# Patient Record
Sex: Male | Born: 1990 | Race: White | Hispanic: No | Marital: Single | Smoking: Current every day smoker
Health system: Southern US, Community
[De-identification: ages and names within clinical notes are randomized; demographics above are authoritative.]

## PROBLEM LIST (undated history)

## (undated) DIAGNOSIS — R011 Cardiac murmur, unspecified: Secondary | ICD-10-CM

---
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
## 2009-06-29 ENCOUNTER — Ambulatory Visit (HOSPITAL_COMMUNITY): Admission: RE | Admit: 2009-06-29 | Discharge: 2009-06-29 | Payer: Self-pay | Admitting: Family Medicine

## 2015-04-09 ENCOUNTER — Emergency Department (HOSPITAL_COMMUNITY)
Admission: EM | Admit: 2015-04-09 | Discharge: 2015-04-09 | Disposition: A | Discharge status: Home / Self Care | Payer: Self-pay | Attending: Emergency Medicine | Admitting: Emergency Medicine

## 2015-04-09 ENCOUNTER — Emergency Department (HOSPITAL_COMMUNITY): Payer: Self-pay

## 2015-04-09 ENCOUNTER — Encounter (HOSPITAL_COMMUNITY): Payer: Self-pay | Admitting: Emergency Medicine

## 2015-04-09 DIAGNOSIS — Y998 Other external cause status: Secondary | ICD-10-CM | POA: Insufficient documentation

## 2015-04-09 DIAGNOSIS — R011 Cardiac murmur, unspecified: Secondary | ICD-10-CM | POA: Insufficient documentation

## 2015-04-09 DIAGNOSIS — Z880 Allergy status to penicillin: Secondary | ICD-10-CM | POA: Insufficient documentation

## 2015-04-09 DIAGNOSIS — Y9311 Activity, swimming: Secondary | ICD-10-CM | POA: Insufficient documentation

## 2015-04-09 DIAGNOSIS — Z720 Tobacco use: Secondary | ICD-10-CM | POA: Insufficient documentation

## 2015-04-09 DIAGNOSIS — Y92828 Other wilderness area as the place of occurrence of the external cause: Secondary | ICD-10-CM | POA: Insufficient documentation

## 2015-04-09 DIAGNOSIS — S8002XA Contusion of left knee, initial encounter: Secondary | ICD-10-CM | POA: Insufficient documentation

## 2015-04-09 DIAGNOSIS — W2209XA Striking against other stationary object, initial encounter: Secondary | ICD-10-CM | POA: Insufficient documentation

## 2015-04-09 HISTORY — DX: Cardiac murmur, unspecified: R01.1

## 2015-04-09 MED ORDER — NAPROXEN 500 MG PO TABS: 500.0000 mg | ORAL_TABLET | Freq: Two times a day (BID) | ORAL | Status: AC

## 2015-04-09 MED ORDER — IBUPROFEN 400 MG PO TABS
800.0000 mg | ORAL_TABLET | Freq: Once | ORAL | Status: DC
  Filled 2015-04-09: qty 2

## 2015-04-09 NOTE — ED Notes (Signed)
Pt at x-ray XDEPT.
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
## 2015-04-09 NOTE — ED Notes (Signed)
Declined W/C at D/C and was escorted to lobby by RN. 
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
## 2015-04-09 NOTE — Discharge Instructions (Signed)
Naprosyn for pain and inflammation. Keep knee elevated. Keep wound clean. Wash with soap and water. Apply bacitracin twice a day. Follow up with her primary care doctor.  Knee Pain The knee is the complex joint between your thigh and your lower leg. It is made up of bones, tendons, ligaments, and cartilage. The bones that make up the knee are:  The femur in the thigh.  The tibia and fibula in the lower leg.  The patella or kneecap riding in the groove on the lower femur. CAUSES  Knee pain is a common complaint with many causes. A few of these causes are:  Injury, such as:  A ruptured ligament or tendon injury.  Torn cartilage.  Medical conditions, such as:  Gout  Arthritis  Infections  Overuse, over training, or overdoing a physical activity. Knee pain can be minor or severe. Knee pain can accompany debilitating injury. Minor knee problems often respond well to self-care measures or get well on their own. More serious injuries may need medical intervention or even surgery. SYMPTOMS The knee is complex. Symptoms of knee problems can vary widely. Some of the problems are:  Pain with movement and weight bearing.  Swelling and tenderness.  Buckling of the knee.  Inability to straighten or extend your knee.  Your knee locks and you cannot straighten it.  Warmth and redness with pain and fever.  Deformity or dislocation of the kneecap. DIAGNOSIS  Determining what is wrong may be very straight forward such as when there is an injury. It can also be challenging because of the complexity of the knee. Tests to make a diagnosis may include:  Your caregiver taking a history and doing a physical exam.  Routine X-rays can be used to rule out other problems. X-rays will not reveal a cartilage tear. Some injuries of the knee can be diagnosed by:  Arthroscopy a surgical technique by which a small video camera is inserted through tiny incisions on the sides of the knee. This  procedure is used to examine and repair internal knee joint problems. Tiny instruments can be used during arthroscopy to repair the torn knee cartilage (meniscus).  Arthrography is a radiology technique. A contrast liquid is directly injected into the knee joint. Internal structures of the knee joint then become visible on X-ray film.  An MRI scan is a non X-ray radiology procedure in which magnetic fields and a computer produce two- or three-dimensional images of the inside of the knee. Cartilage tears are often visible using an MRI scanner. MRI scans have largely replaced arthrography in diagnosing cartilage tears of the knee.  Blood work.  Examination of the fluid that helps to lubricate the knee joint (synovial fluid). This is done by taking a sample out using a needle and a syringe. TREATMENT The treatment of knee problems depends on the cause. Some of these treatments are:  Depending on the injury, proper casting, splinting, surgery, or physical therapy care will be needed.  Give yourself adequate recovery time. Do not overuse your joints. If you begin to get sore during workout routines, back off. Slow down or do fewer repetitions.  For repetitive activities such as cycling or running, maintain your strength and nutrition.  Alternate muscle groups. For example, if you are a weight lifter, work the upper body on one day and the lower body the next.  Either tight or weak muscles do not give the proper support for your knee. Tight or weak muscles do not absorb the stress placed  on the knee joint. Keep the muscles surrounding the knee strong.  Take care of mechanical problems.  If you have flat feet, orthotics or special shoes may help. See your caregiver if you need help.  Arch supports, sometimes with wedges on the inner or outer aspect of the heel, can help. These can shift pressure away from the side of the knee most bothered by osteoarthritis.  A brace called an "unloader" brace  also may be used to help ease the pressure on the most arthritic side of the knee.  If your caregiver has prescribed crutches, braces, wraps or ice, use as directed. The acronym for this is PRICE. This means protection, rest, ice, compression, and elevation.  Nonsteroidal anti-inflammatory drugs (NSAIDs), can help relieve pain. But if taken immediately after an injury, they may actually increase swelling. Take NSAIDs with food in your stomach. Stop them if you develop stomach problems. Do not take these if you have a history of ulcers, stomach pain, or bleeding from the bowel. Do not take without your caregiver's approval if you have problems with fluid retention, heart failure, or kidney problems.  For ongoing knee problems, physical therapy may be helpful.  Glucosamine and chondroitin are over-the-counter dietary supplements. Both may help relieve the pain of osteoarthritis in the knee. These medicines are different from the usual anti-inflammatory drugs. Glucosamine may decrease the rate of cartilage destruction.  Injections of a corticosteroid drug into your knee joint may help reduce the symptoms of an arthritis flare-up. They may provide pain relief that lasts a few months. You may have to wait a few months between injections. The injections do have a small increased risk of infection, water retention, and elevated blood sugar levels.  Hyaluronic acid injected into damaged joints may ease pain and provide lubrication. These injections may work by reducing inflammation. A series of shots may give relief for as long as 6 months.  Topical painkillers. Applying certain ointments to your skin may help relieve the pain and stiffness of osteoarthritis. Ask your pharmacist for suggestions. Many over the-counter products are approved for temporary relief of arthritis pain.  In some countries, doctors often prescribe topical NSAIDs for relief of chronic conditions such as arthritis and tendinitis. A  review of treatment with NSAID creams found that they worked as well as oral medications but without the serious side effects. PREVENTION  Maintain a healthy weight. Extra pounds put more strain on your joints.  Get strong, stay limber. Weak muscles are a common cause of knee injuries. Stretching is important. Include flexibility exercises in your workouts.  Be smart about exercise. If you have osteoarthritis, chronic knee pain or recurring injuries, you may need to change the way you exercise. This does not mean you have to stop being active. If your knees ache after jogging or playing basketball, consider switching to swimming, water aerobics, or other low-impact activities, at least for a few days a week. Sometimes limiting high-impact activities will provide relief.  Make sure your shoes fit well. Choose footwear that is right for your sport.  Protect your knees. Use the proper gear for knee-sensitive activities. Use kneepads when playing volleyball or laying carpet. Buckle your seat belt every time you drive. Most shattered kneecaps occur in car accidents.  Rest when you are tired. SEEK MEDICAL CARE IF:  You have knee pain that is continual and does not seem to be getting better.  SEEK IMMEDIATE MEDICAL CARE IF:  Your knee joint feels hot to the touch  and you have a high fever. MAKE SURE YOU:   Understand these instructions.  Will watch your condition.  Will get help right away if you are not doing well or get worse. Document Released: 07/06/2007 Document Revised: 12/01/2011 Document Reviewed: 07/06/2007 [facility] Patient Information 2015 [place], [place]. This information is not intended to replace advice given to you by your health care provider. Make sure you discuss any questions you have with your health care provider.

## 2015-04-13 ENCOUNTER — Ambulatory Visit: Payer: Self-pay | Admitting: Family Medicine

## 2015-05-17 ENCOUNTER — Encounter (HOSPITAL_COMMUNITY): Payer: Self-pay | Admitting: *Deleted

## 2015-05-17 ENCOUNTER — Emergency Department (HOSPITAL_COMMUNITY): Payer: Self-pay

## 2015-05-17 ENCOUNTER — Emergency Department (HOSPITAL_COMMUNITY)
Admission: EM | Admit: 2015-05-17 | Discharge: 2015-05-17 | Disposition: A | Discharge status: Home / Self Care | Payer: Self-pay | Attending: Emergency Medicine | Admitting: Emergency Medicine

## 2015-05-17 DIAGNOSIS — Y9289 Other specified places as the place of occurrence of the external cause: Secondary | ICD-10-CM | POA: Insufficient documentation

## 2015-05-17 DIAGNOSIS — Y998 Other external cause status: Secondary | ICD-10-CM | POA: Insufficient documentation

## 2015-05-17 DIAGNOSIS — Y9389 Activity, other specified: Secondary | ICD-10-CM | POA: Insufficient documentation

## 2015-05-17 DIAGNOSIS — S0191XA Laceration without foreign body of unspecified part of head, initial encounter: Secondary | ICD-10-CM

## 2015-05-17 DIAGNOSIS — T148XXA Other injury of unspecified body region, initial encounter: Secondary | ICD-10-CM

## 2015-05-17 DIAGNOSIS — R011 Cardiac murmur, unspecified: Secondary | ICD-10-CM | POA: Insufficient documentation

## 2015-05-17 DIAGNOSIS — S0990XA Unspecified injury of head, initial encounter: Secondary | ICD-10-CM

## 2015-05-17 DIAGNOSIS — S0181XA Laceration without foreign body of other part of head, initial encounter: Secondary | ICD-10-CM | POA: Insufficient documentation

## 2015-05-17 DIAGNOSIS — Z23 Encounter for immunization: Secondary | ICD-10-CM | POA: Insufficient documentation

## 2015-05-17 DIAGNOSIS — Z720 Tobacco use: Secondary | ICD-10-CM | POA: Insufficient documentation

## 2015-05-17 DIAGNOSIS — S060X1A Concussion with loss of consciousness of 30 minutes or less, initial encounter: Secondary | ICD-10-CM | POA: Insufficient documentation

## 2015-05-17 MED ORDER — TETANUS-DIPHTH-ACELL PERTUSSIS 5-2.5-18.5 LF-MCG/0.5 IM SUSP
0.5000 mL | Freq: Once | INTRAMUSCULAR | Status: AC
  Administered 2015-05-17: 0.5 mL via INTRAMUSCULAR
  Filled 2015-05-17: qty 0.5

## 2015-05-17 MED ORDER — ONDANSETRON HCL 4 MG PO TABS: 4.0000 mg | ORAL_TABLET | Freq: Four times a day (QID) | ORAL | Status: AC

## 2015-05-17 MED ORDER — TRAMADOL HCL 50 MG PO TABS: 50.0000 mg | ORAL_TABLET | Freq: Four times a day (QID) | ORAL | Status: AC | PRN

## 2015-05-17 NOTE — ED Notes (Addendum)
Difficult time assessing patient. Patient not cooperative.  Frequent use of curse words while in triage
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
## 2015-05-17 NOTE — ED Notes (Signed)
Pt left with all belongings and refused wheelchair. Discharge instructions were reviewed and all questions were answered.  

## 2015-05-17 NOTE — Discharge Instructions (Signed)
Concussion °A concussion is a brain injury. It is caused by: °· A hit to the head. °· A quick and sudden movement (jolt) of the head or neck. °A concussion is usually not life threatening. Even so, it can cause serious problems. If you had a concussion before, you may have concussion-like problems after a hit to your head. °HOME CARE °General Instructions °· Follow your doctor's directions carefully. °· Take medicines only as told by your doctor. °· Only take medicines your doctor says are safe. °· Do not drink alcohol until your doctor says it is okay. Alcohol and some drugs can slow down healing. They can also put you at risk for further injury. °· If you are having trouble remembering things, write them down. °· Try to do one thing at a time if you get distracted easily. For example, do not watch TV while making dinner. °· Talk to your family members or close friends when making important decisions. °· Follow up with your doctor as told. °· Watch your symptoms. Tell others to do the same. Serious problems can sometimes happen after a concussion. Older adults are more likely to have these problems. °· Tell your teachers, school nurse, school counselor, coach, athletic trainer, or work manager about your concussion. Tell them about what you can or cannot do. They should watch to see if: °¨ It gets even harder for you to pay attention or concentrate. °¨ It gets even harder for you to remember things or learn new things. °¨ You need more time than normal to finish things. °¨ You become annoyed (irritable) more than before. °¨ You are not able to deal with stress as well. °¨ You have more problems than before. °· Rest. Make sure you: °¨ Get plenty of sleep at night. °¨ Go to sleep early. °¨ Go to bed at the same time every day. Try to wake up at the same time. °¨ Rest during the day. °¨ Take naps when you feel tired. °· Limit activities where you have to think a lot or concentrate. These include: °¨ Doing  homework. °¨ Doing work related to a job. °¨ Watching TV. °¨ Using the computer. °Returning To Your Regular Activities °Return to your normal activities slowly, not all at once. You must give your body and brain enough time to heal.  °· Do not play sports or do other athletic activities until your doctor says it is okay. °· Ask your doctor when you can drive, ride a bicycle, or work other vehicles or machines. Never do these things if you feel dizzy. °· Ask your doctor about when you can return to work or school. °Preventing Another Concussion °It is very important to avoid another brain injury, especially before you have healed. In rare cases, another injury can lead to permanent brain damage, brain swelling, or death. The risk of this is greatest during the first 7-10 days after your injury. Avoid injuries by:  °· Wearing a seat belt when riding in a car. °· Not drinking too much alcohol. °· Avoiding activities that could lead to a second concussion (such as contact sports). °· Wearing a helmet when doing activities like: °¨ Biking. °¨ Skiing. °¨ Skateboarding. °¨ Skating. °· Making your home safer by: °¨ Removing things from the floor or stairways that could make you trip. °¨ Using grab bars in bathrooms and handrails by stairs. °¨ Placing non-slip mats on floors and in bathtubs. °¨ Improve lighting in dark areas. °GET HELP IF: °· It   gets even harder for you to pay attention or concentrate. °· It gets even harder for you to remember things or learn new things. °· You need more time than normal to finish things. °· You become annoyed (irritable) more than before. °· You are not able to deal with stress as well. °· You have more problems than before. °· You have problems keeping your balance. °· You are not able to react quickly when you should. °Get help if you have any of these problems for more than 2 weeks:  °· Lasting (chronic) headaches. °· Dizziness or trouble balancing. °· Feeling sick to your stomach  (nausea). °· Seeing (vision) problems. °· Being affected by noises or light more than normal. °· Feeling sad, low, down in the dumps, blue, gloomy, or empty (depressed). °· Mood changes (mood swings). °· Feeling of fear or nervousness about what may happen (anxiety). °· Feeling annoyed. °· Memory problems. °· Problems concentrating or paying attention. °· Sleep problems. °· Feeling tired all the time. °GET HELP RIGHT AWAY IF:  °· You have bad headaches or your headaches get worse. °· You have weakness (even if it is in one hand, leg, or part of the face). °· You have loss of feeling (numbness). °· You feel off balance. °· You keep throwing up (vomiting). °· You feel tired. °· One black center of your eye (pupil) is larger than the other. °· You twitch or shake violently (convulse). °· Your speech is not clear (slurred). °· You are more confused, easily angered (agitated), or annoyed than before. °· You have more trouble resting than before. °· You are unable to recognize people or places. °· You have neck pain. °· It is difficult to wake you up. °· You have unusual behavior changes. °· You pass out (lose consciousness). °MAKE SURE YOU:  °· Understand these instructions. °· Will watch your condition. °· Will get help right away if you are not doing well or get worse. °Document Released: 08/27/2009 Document Revised: 01/23/2014 Document Reviewed: 03/31/2013 °[employer]® Patient Information ©2015 [employer]. This information is not intended to replace advice given to you by your health care provider. Make sure you discuss any questions you have with your health care provider. ° °
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
## 2015-05-29 ENCOUNTER — Encounter (HOSPITAL_COMMUNITY): Payer: Self-pay | Admitting: Emergency Medicine

## 2015-05-29 ENCOUNTER — Emergency Department (HOSPITAL_COMMUNITY)
Admission: EM | Admit: 2015-05-29 | Discharge: 2015-05-29 | Discharge status: Against Medical Advice | Payer: Self-pay | Attending: Emergency Medicine | Admitting: Emergency Medicine

## 2015-05-29 DIAGNOSIS — R51 Headache: Secondary | ICD-10-CM | POA: Insufficient documentation

## 2015-05-29 DIAGNOSIS — R011 Cardiac murmur, unspecified: Secondary | ICD-10-CM | POA: Insufficient documentation

## 2015-05-29 DIAGNOSIS — Z720 Tobacco use: Secondary | ICD-10-CM | POA: Insufficient documentation
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
## 2015-05-29 NOTE — ED Notes (Signed)
Called for patient, no answer.
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
## 2017-05-18 ENCOUNTER — Encounter (HOSPITAL_COMMUNITY): Payer: Self-pay

## 2017-05-18 ENCOUNTER — Emergency Department (HOSPITAL_COMMUNITY)
Admission: EM | Admit: 2017-05-18 | Discharge: 2017-05-18 | Disposition: A | Discharge status: Home / Self Care | Payer: Self-pay | Attending: Emergency Medicine | Admitting: Emergency Medicine

## 2017-05-18 DIAGNOSIS — Z5321 Procedure and treatment not carried out due to patient leaving prior to being seen by health care provider: Secondary | ICD-10-CM | POA: Insufficient documentation

## 2017-05-18 DIAGNOSIS — N50819 Testicular pain, unspecified: Secondary | ICD-10-CM | POA: Insufficient documentation

## 2017-05-18 LAB — COMPREHENSIVE METABOLIC PANEL
ALT: 43 U/L (ref 17–63)
ANION GAP: 8 (ref 5–15)
AST: 68 U/L — AB (ref 15–41)
Albumin: 4.6 g/dL (ref 3.5–5.0)
Alkaline Phosphatase: 47 U/L (ref 38–126)
BUN: 18 mg/dL (ref 6–20)
CHLORIDE: 104 mmol/L (ref 101–111)
CO2: 27 mmol/L (ref 22–32)
Calcium: 9.5 mg/dL (ref 8.9–10.3)
Creatinine, Ser: 1.3 mg/dL — ABNORMAL HIGH (ref 0.61–1.24)
GFR calc Af Amer: >60 mL/min (ref >60)
Glucose, Bld: 100 mg/dL — ABNORMAL HIGH (ref 65–99)
POTASSIUM: 4.3 mmol/L (ref 3.5–5.1)
Sodium: 139 mmol/L (ref 135–145)
TOTAL PROTEIN: 7.6 g/dL (ref 6.5–8.1)
Total Bilirubin: 0.8 mg/dL (ref 0.3–1.2)

## 2017-05-18 LAB — LIPASE, BLOOD: Lipase: 34 U/L (ref 11–51)

## 2017-05-18 LAB — CBC
HEMATOCRIT: 41 % (ref 39.0–52.0)
HEMOGLOBIN: 14.3 g/dL (ref 13.0–17.0)
MCH: 31.4 pg (ref 26.0–34.0)
MCHC: 34.9 g/dL (ref 30.0–36.0)
MCV: 90.1 fL (ref 78.0–100.0)
Platelets: 167 10*3/uL (ref 150–400)
RBC: 4.55 MIL/uL (ref 4.22–5.81)
RDW: 12.7 % (ref 11.5–15.5)
WBC: 10.7 10*3/uL — ABNORMAL HIGH (ref 4.0–10.5)

## 2017-05-18 NOTE — ED Notes (Signed)
Pt called to reassess vitals. 

## 2017-05-18 NOTE — ED Triage Notes (Signed)
Patient c/o having intermittent left groin pain and left testicle pain x 2 months. Patient does a lot of lifting. Pain has gotten progressively worse.

## 2017-05-20 ENCOUNTER — Emergency Department (HOSPITAL_COMMUNITY)
Admission: EM | Admit: 2017-05-20 | Discharge: 2017-05-21 | Disposition: A | Discharge status: Home / Self Care | Payer: Self-pay | Attending: Emergency Medicine | Admitting: Emergency Medicine

## 2017-05-20 ENCOUNTER — Encounter (HOSPITAL_COMMUNITY): Payer: Self-pay | Admitting: Emergency Medicine

## 2017-05-20 DIAGNOSIS — Z5321 Procedure and treatment not carried out due to patient leaving prior to being seen by health care provider: Secondary | ICD-10-CM | POA: Insufficient documentation

## 2017-05-20 DIAGNOSIS — R109 Unspecified abdominal pain: Secondary | ICD-10-CM | POA: Insufficient documentation

## 2017-05-20 NOTE — ED Triage Notes (Signed)
Pt states that he has had abdominal pain x 2 months that has worsened recently. Alert and oriented. 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

## 2018-09-30 ENCOUNTER — Encounter (HOSPITAL_BASED_OUTPATIENT_CLINIC_OR_DEPARTMENT_OTHER): Payer: Self-pay | Admitting: *Deleted

## 2018-09-30 ENCOUNTER — Other Ambulatory Visit: Payer: Self-pay

## 2018-09-30 ENCOUNTER — Emergency Department (HOSPITAL_BASED_OUTPATIENT_CLINIC_OR_DEPARTMENT_OTHER)
Admission: EM | Admit: 2018-09-30 | Discharge: 2018-09-30 | Disposition: A | Discharge status: Home / Self Care | Payer: 59 | Attending: Emergency Medicine | Admitting: Emergency Medicine

## 2018-09-30 DIAGNOSIS — Z79899 Other long term (current) drug therapy: Secondary | ICD-10-CM | POA: Insufficient documentation

## 2018-09-30 DIAGNOSIS — H10023 Other mucopurulent conjunctivitis, bilateral: Secondary | ICD-10-CM

## 2018-09-30 DIAGNOSIS — F17210 Nicotine dependence, cigarettes, uncomplicated: Secondary | ICD-10-CM | POA: Insufficient documentation

## 2018-09-30 DIAGNOSIS — H5789 Other specified disorders of eye and adnexa: Secondary | ICD-10-CM | POA: Diagnosis present

## 2018-09-30 MED ORDER — FLUORESCEIN SODIUM 1 MG OP STRP
2.0000 | ORAL_STRIP | Freq: Once | OPHTHALMIC | Status: AC
Start: 2018-09-30 — End: 2018-09-30
  Administered 2018-09-30: 2 via OPHTHALMIC
  Filled 2018-09-30: qty 2

## 2018-09-30 MED ORDER — HYDROCODONE-ACETAMINOPHEN 5-325 MG PO TABS
1.0000 | ORAL_TABLET | Freq: Once | ORAL | Status: AC
  Administered 2018-09-30: 1 via ORAL
  Filled 2018-09-30: qty 1

## 2018-09-30 MED ORDER — FLUORESCEIN SODIUM 1 MG OP STRP
ORAL_STRIP | OPHTHALMIC | Status: AC
  Administered 2018-09-30: 21:00:00
  Filled 2018-09-30: qty 1

## 2018-09-30 MED ORDER — TETRACAINE HCL 0.5 % OP SOLN
2.0000 [drp] | Freq: Once | OPHTHALMIC | Status: AC
  Administered 2018-09-30: 2 [drp] via OPHTHALMIC
  Filled 2018-09-30: qty 4

## 2018-09-30 MED ORDER — CIPROFLOXACIN HCL 0.3 % OP SOLN
2.0000 [drp] | Freq: Once | OPHTHALMIC | Status: AC
  Administered 2018-09-30: 2 [drp] via OPHTHALMIC
  Filled 2018-09-30: qty 2.5
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
## 2018-09-30 NOTE — Discharge Instructions (Addendum)
Ibuprofen or tylenol for eye discomfort. Apply antibiotic, 2 drops, in each eye every 4 hours while awake. Refer to the attached instructions. Follow up with the eye specialist listed if no improvement in symptoms over the next 2-3 days.
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
## 2020-01-23 ENCOUNTER — Emergency Department (HOSPITAL_COMMUNITY)
Admission: EM | Admit: 2020-01-23 | Discharge: 2020-01-23 | Disposition: A | Discharge status: Home / Self Care | Payer: 59 | Attending: Emergency Medicine | Admitting: Emergency Medicine

## 2020-01-23 ENCOUNTER — Other Ambulatory Visit: Payer: Self-pay

## 2020-01-23 ENCOUNTER — Encounter (HOSPITAL_COMMUNITY): Payer: Self-pay

## 2020-01-23 DIAGNOSIS — Z5321 Procedure and treatment not carried out due to patient leaving prior to being seen by health care provider: Secondary | ICD-10-CM | POA: Insufficient documentation

## 2020-01-23 DIAGNOSIS — K0889 Other specified disorders of teeth and supporting structures: Secondary | ICD-10-CM | POA: Insufficient documentation

## 2020-01-23 DIAGNOSIS — H9203 Otalgia, bilateral: Secondary | ICD-10-CM | POA: Insufficient documentation

## 2020-01-23 NOTE — ED Triage Notes (Signed)
Patient c/o left lower dental pain "for a while" and c/o bilateral ear pain L>R.
